# Patient Record
Sex: Female | Born: 1990 | Race: Black or African American | Hispanic: No | Marital: Single | Smoking: Never smoker
Health system: Southern US, Community
[De-identification: ages and names within clinical notes are randomized; demographics above are authoritative.]

---

## 2012-05-18 ENCOUNTER — Encounter (HOSPITAL_COMMUNITY): Payer: Self-pay | Admitting: Emergency Medicine

## 2012-05-18 ENCOUNTER — Emergency Department (HOSPITAL_COMMUNITY): Payer: BC Managed Care – PPO

## 2012-05-18 ENCOUNTER — Emergency Department (HOSPITAL_COMMUNITY)
Admission: EM | Admit: 2012-05-18 | Discharge: 2012-05-18 | Disposition: A | Discharge status: Home / Self Care | Payer: BC Managed Care – PPO | Attending: Emergency Medicine | Admitting: Emergency Medicine

## 2012-05-18 DIAGNOSIS — R1031 Right lower quadrant pain: Secondary | ICD-10-CM | POA: Insufficient documentation

## 2012-05-18 DIAGNOSIS — R109 Unspecified abdominal pain: Secondary | ICD-10-CM

## 2012-05-18 LAB — CBC WITH DIFFERENTIAL/PLATELET
Basophils Absolute: 0 10*3/uL (ref 0.0–0.1)
Basophils Relative: 0 % (ref 0–1)
Hemoglobin: 12.8 g/dL (ref 12.0–15.0)
Lymphocytes Relative: 19 % (ref 12–46)
MCHC: 33.5 g/dL (ref 30.0–36.0)
Neutro Abs: 6.4 10*3/uL (ref 1.7–7.7)
Neutrophils Relative %: 76 % (ref 43–77)
RDW: 12.7 % (ref 11.5–15.5)
WBC: 8.4 10*3/uL (ref 4.0–10.5)

## 2012-05-18 LAB — COMPREHENSIVE METABOLIC PANEL
ALT: 8 U/L (ref 0–35)
AST: 14 U/L (ref 0–37)
Albumin: 3.6 g/dL (ref 3.5–5.2)
Alkaline Phosphatase: 49 U/L (ref 39–117)
Chloride: 101 mEq/L (ref 96–112)
Potassium: 3.6 mEq/L (ref 3.5–5.1)
Total Bilirubin: 0.5 mg/dL (ref 0.3–1.2)

## 2012-05-18 LAB — URINALYSIS, ROUTINE W REFLEX MICROSCOPIC
Glucose, UA: NEGATIVE mg/dL
Protein, ur: 30 mg/dL — AB
Specific Gravity, Urine: 1.018 (ref 1.005–1.030)
Urobilinogen, UA: 1 mg/dL (ref 0.0–1.0)

## 2012-05-18 LAB — PREGNANCY, URINE: Preg Test, Ur: NEGATIVE

## 2012-05-18 MED ORDER — SODIUM CHLORIDE 0.9 % IV SOLN
INTRAVENOUS | Status: DC
  Administered 2012-05-18: 08:00:00 via INTRAVENOUS

## 2012-05-18 MED ORDER — PEG 3350-KCL-NABCB-NACL-NASULF 236 G PO SOLR: 4.0000 L | Freq: Once | ORAL | Status: DC

## 2012-05-18 MED ORDER — IOHEXOL 300 MG/ML  SOLN
100.0000 mL | Freq: Once | INTRAMUSCULAR | Status: AC | PRN
  Administered 2012-05-18: 100 mL via INTRAVENOUS

## 2012-05-18 MED ORDER — SODIUM CHLORIDE 0.9 % IV BOLUS (SEPSIS)
500.0000 mL | Freq: Once | INTRAVENOUS | Status: AC
  Administered 2012-05-18: 500 mL via INTRAVENOUS

## 2012-05-18 MED ORDER — SULFAMETHOXAZOLE-TRIMETHOPRIM 800-160 MG PO TABS: 1.0000 | ORAL_TABLET | Freq: Two times a day (BID) | ORAL | Status: DC

## 2012-05-18 NOTE — ED Notes (Signed)
Pt drinking contrast dye for CT scan

## 2012-05-18 NOTE — ED Provider Notes (Signed)
History     CSN: [id number]  Arrival date & time 05/18/12  [id number]   First MD Initiated Contact with Patient 05/18/12 [phone]      Chief Complaint  Patient presents with  . Abdominal Pain    (Consider location/radiation/quality/duration/timing/severity/associated sxs/prior treatment) HPI This 21 year old complains of right lower quadrant abdominal pain. She states 3 days ago she gradual onset of abdominal pain it is more in the left side than gradually transitioned to the mid lower abdomen area by the pelvic area and now has transitioned to the right lower quadrant tenderness only in the right lower quadrant. She's had right lower quadrant pain constantly since yesterday it waxes and wanes but is gradually worsening in intensity it is mild to moderately severe. It is worse position changes and bit her she stays still. It is associated nausea since yesterday with vomiting a few times over night which is nonbloody. She's not had a bowel movement in the last 3 days which is unusual for her. Her last bowel movement was 3 days ago with small little bit loose. It is nonbloody. She is no dysuria urinary frequency vaginal bleeding or vaginal discharge. Her last menstrual cycle was 2 weeks ago normal for her. Is no fever body aches rashes trauma cough chest pain or shortness of breath. No treatment prior to arrival. She does not want pain medicines at this time. History reviewed. No pertinent past medical history.  History reviewed. No pertinent past surgical history.  No family history on file.  History  Substance Use Topics  . Smoking status: Never Smoker   . Smokeless tobacco: Not on file  . Alcohol Use: No    OB History    Grav Para Term Preterm Abortions TAB SAB Ect Mult Living                  Review of Systems 10 Systems reviewed and are negative for acute change except as noted in the HPI. Allergies  Review of patient's allergies indicates no known allergies.  Home Medications    Current Outpatient Rx  Name Route Sig Dispense Refill  . IBUPROFEN 200 MG PO TABS Oral Take 400 mg by mouth every 6 (six) hours as needed.    Marland Kitchen PEG 3350-KCL-NABCB-NACL-NASULF 236 G PO SOLR Oral Take 4,000 mLs by mouth once. 4000 mL 0  . SULFAMETHOXAZOLE-TRIMETHOPRIM 800-160 MG PO TABS Oral Take 1 tablet by mouth 2 (two) times daily. One po bid x 3 days 6 tablet 0    BP 121/71  Pulse 78  Temp 98.2 F (36.8 C) (Oral)  Resp 14  Ht 5\' 2"  (1.575 m)  Wt 137 lb (62.143 kg)  BMI 25.06 kg/m2  SpO2 100%  LMP 05/04/2012  Physical Exam  Nursing note and vitals reviewed. Constitutional:       Awake, alert, nontoxic appearance.  HENT:  Head: Atraumatic.  Eyes: Right eye exhibits no discharge. Left eye exhibits no discharge.  Neck: Neck supple.  Cardiovascular: Normal rate and regular rhythm.   No murmur heard. Pulmonary/Chest: Effort normal and breath sounds normal. No respiratory distress. She has no wheezes. She has no rales. She exhibits no tenderness.  Abdominal: Soft. Bowel sounds are normal. She exhibits no distension and no mass. There is tenderness. There is no rebound and no guarding.       Mild right lower quadrant tenderness without rebound and the rest of the abdomen is nontender  Genitourinary:       Chaperone is present for bimanual  examination with no cervical motion tenderness no adnexal tenderness no pelvic tenderness no blood or discharge on the examination glove, check also present for rectal examination revealing rectal vault empty; back shows no CVA tenderness  Musculoskeletal: She exhibits no tenderness.       Baseline ROM, no obvious new focal weakness.  Neurological: She is alert.       Mental status and motor strength appears baseline for patient and situation.  Skin: No rash noted.  Psychiatric: She has a normal mood and affect.    ED Course  Procedures (including critical care time)  Labs Reviewed  URINALYSIS, ROUTINE W REFLEX MICROSCOPIC - Abnormal;  Notable for the following:    APPearance CLOUDY (*)     Hgb urine dipstick SMALL (*)     Protein, ur 30 (*)     Leukocytes, UA MODERATE (*)     All other components within normal limits  PREGNANCY, URINE  CBC WITH DIFFERENTIAL  COMPREHENSIVE METABOLIC PANEL  URINE MICROSCOPIC-ADD ON  LAB REPORT - SCANNED   No results found.   1. Abdominal pain       MDM  Pt stable in ED with no significant deterioration in condition.  Patient / Family / Caregiver informed of clinical course, understand medical decision-making process, and agree with plan.  I doubt any other EMC precluding discharge at this time including, but not necessarily limited to the following:SBI.        Hurman Horn, MD 05/23/12 [phone]

## 2012-05-18 NOTE — ED Notes (Signed)
Pt c/o abd pain onset yesterday. Pt states last normal BM last weekend. Pt states she had small amount diarrhea 3 days ago. Pt tearful.

## 2012-05-18 NOTE — ED Notes (Signed)
Pt alert, arrives from home, c/o abd pain, onset was last week, states started in left lower quad, now presents to right, describes pain as sharp, non radiating, states constipation, denies changes in bladder habits, resp even unlabored, skin pwd

## 2013-07-13 IMAGING — CT CT ABD-PELV W/ CM
1 of 2 series · 15 of 32 positions shown, 19 images · IV contrast (OMNIPAQUE 300)
Comparison: None.

CLINICAL DATA: Right lower quadrant pain, rule out appendicitis

CT ABDOMEN AND PELVIS WITH CONTRAST
TECHNIQUE: Multidetector CT imaging of the abdomen and pelvis was
performed following the standard protocol during bolus
administration of intravenous contrast.
Contrast: 100mL OMNIPAQUE IOHEXOL 300 MG/ML  SOLN

[Series 2: abd/pel with · axial · 0.58mm/px · z∈[+852,+1222]mm · 15 of 82 slices shown, 19 images]
[im 4/82  soft-tissue]
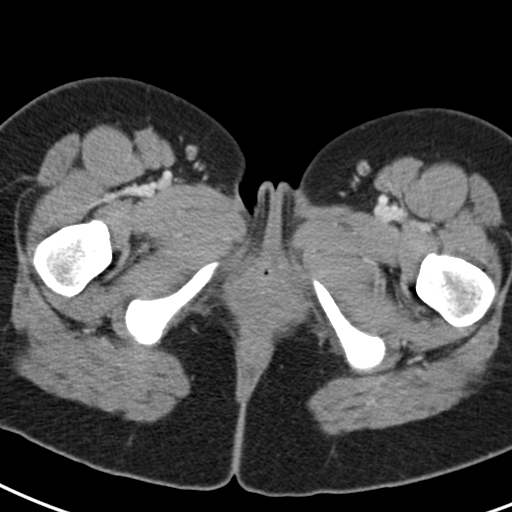
[im 4/82  bone]
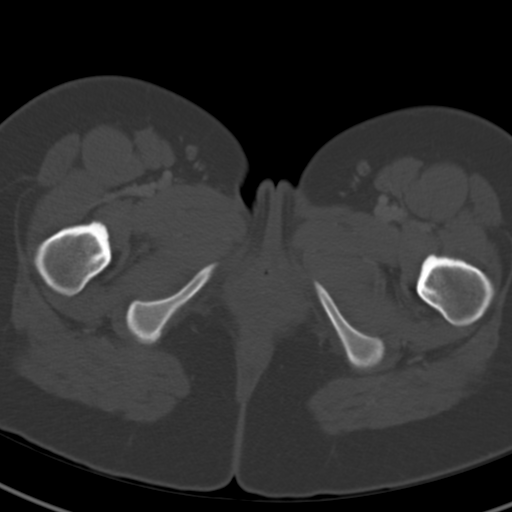
[im 10/82  soft-tissue]
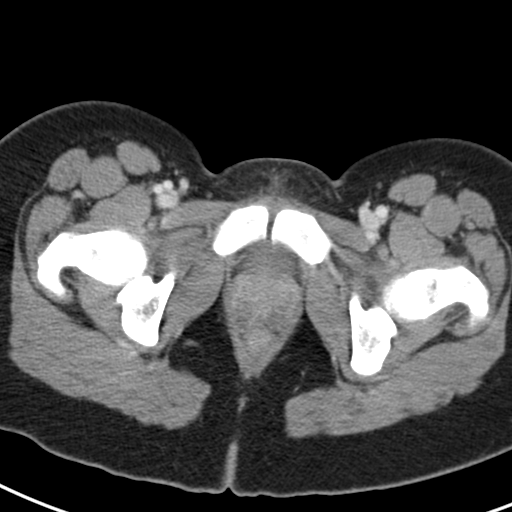
[im 17/82  soft-tissue]
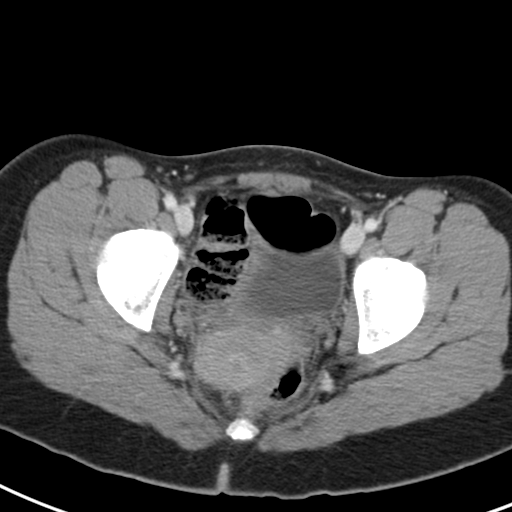
[im 23/82  soft-tissue]
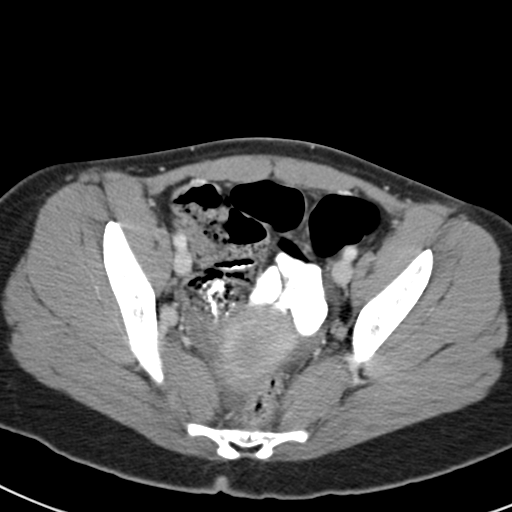
[im 30/82  soft-tissue]
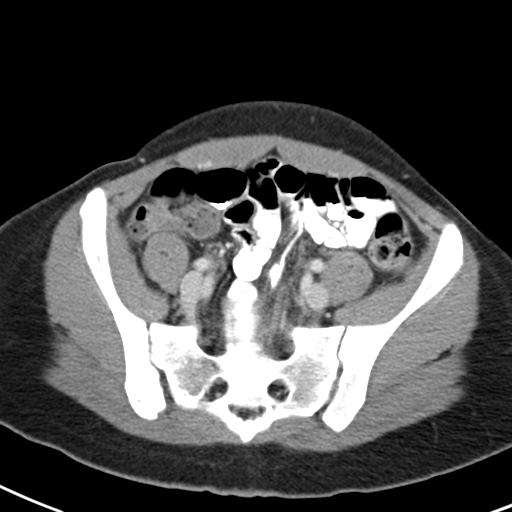
[im 36/82  soft-tissue]
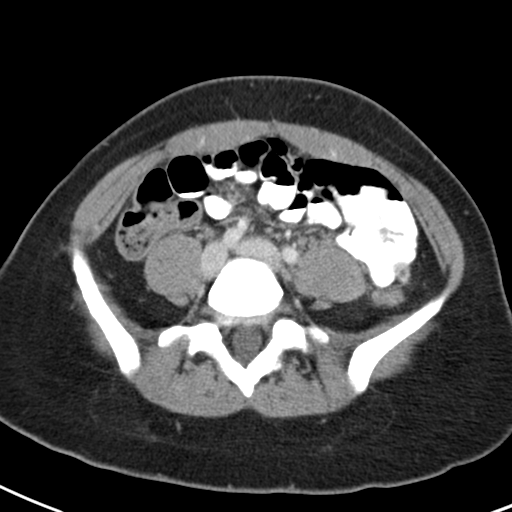
[im 43/82  soft-tissue]
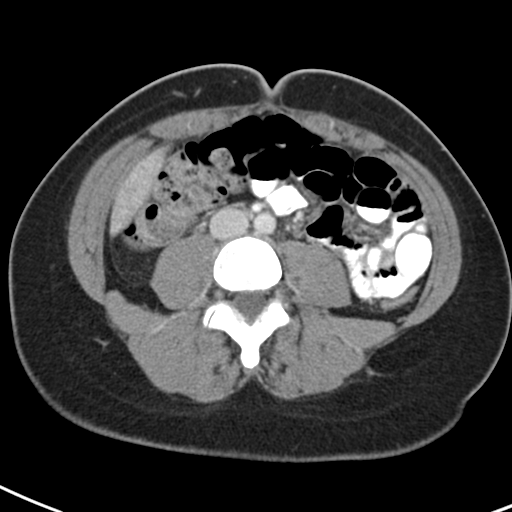
[im 46/82  soft-tissue]
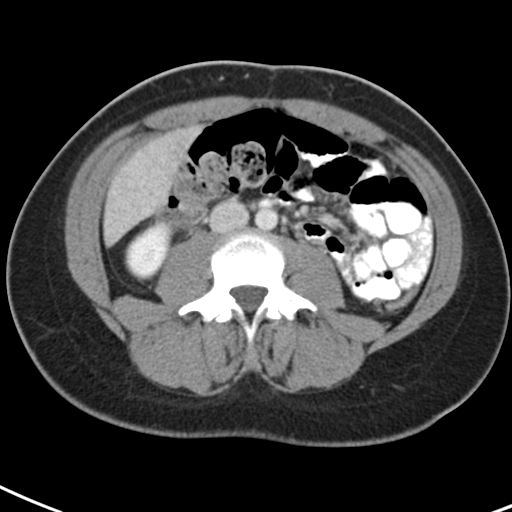
[im 52/82  soft-tissue]
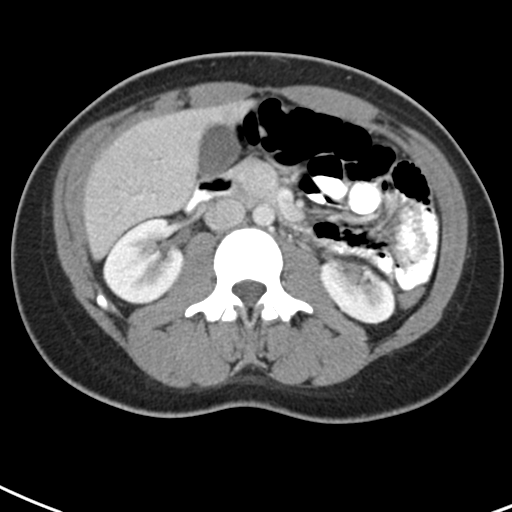
[im 52/82  bone]
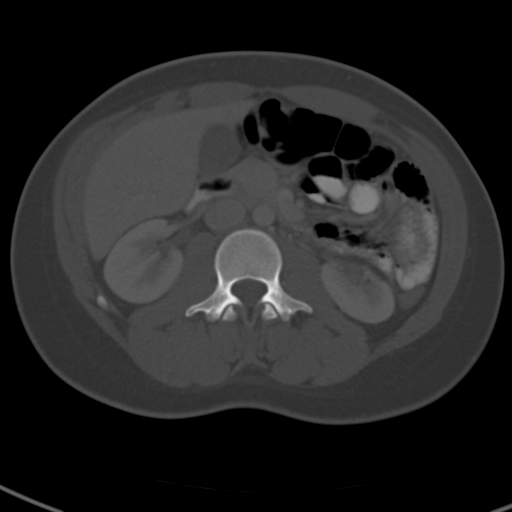
[im 59/82  soft-tissue]
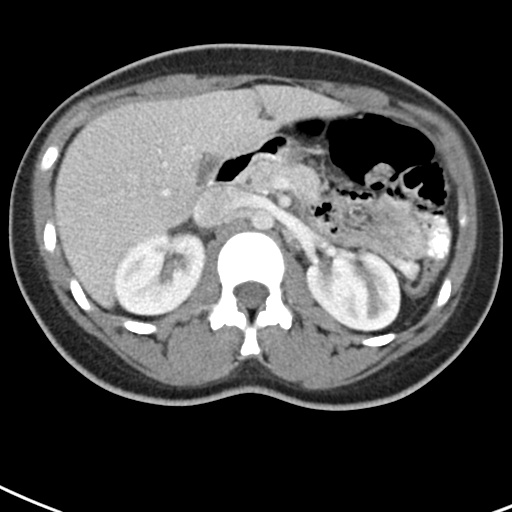
[im 65/82  soft-tissue]
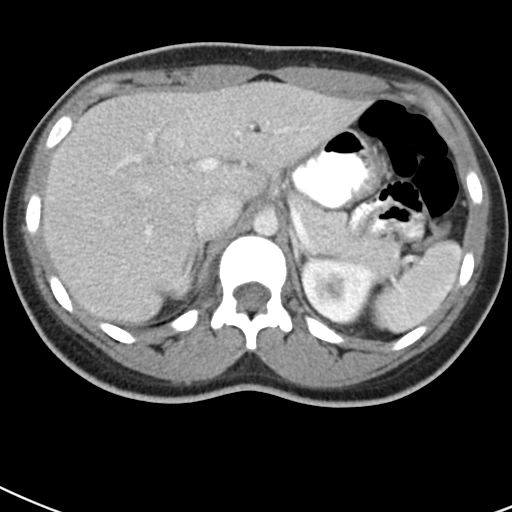
[im 69/82  lung]
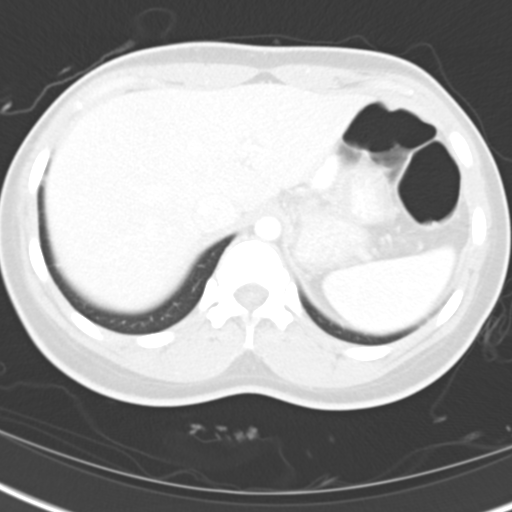
[im 72/82  soft-tissue]
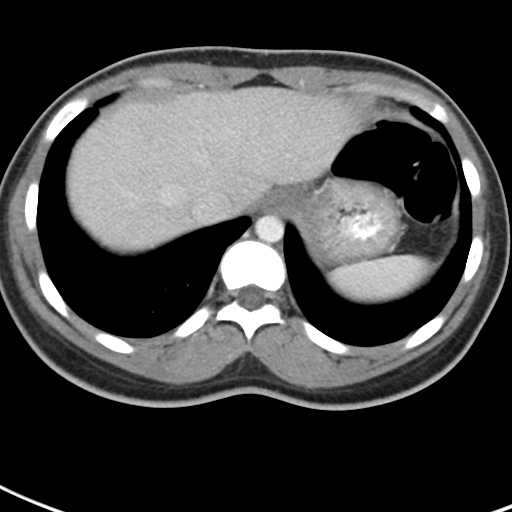
[im 72/82  lung]
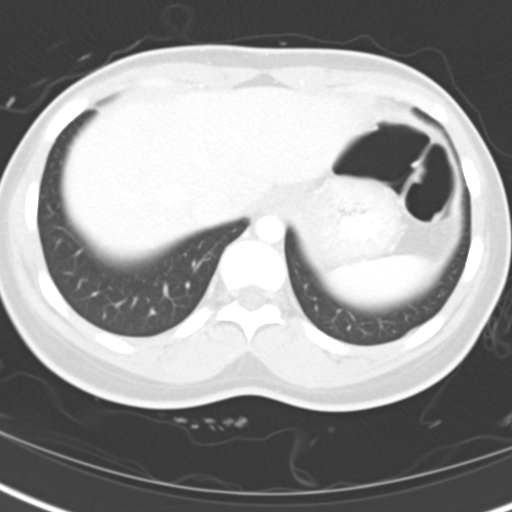
[im 75/82  lung]
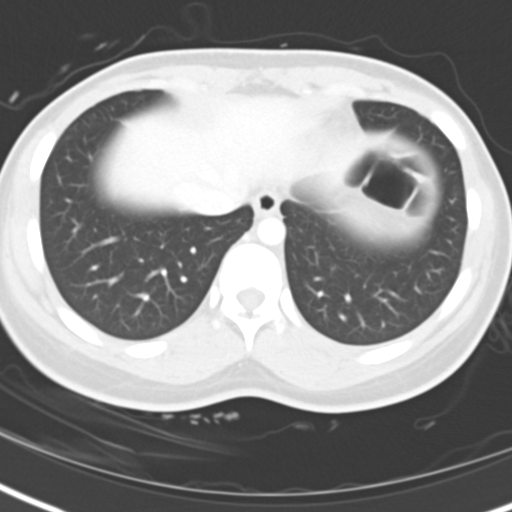
[im 78/82  soft-tissue]
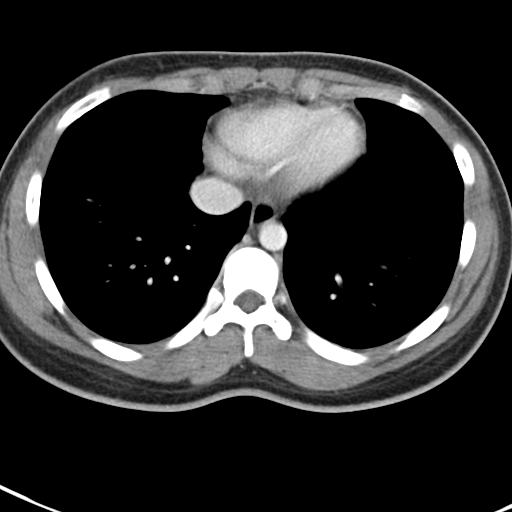
[im 78/82  lung]
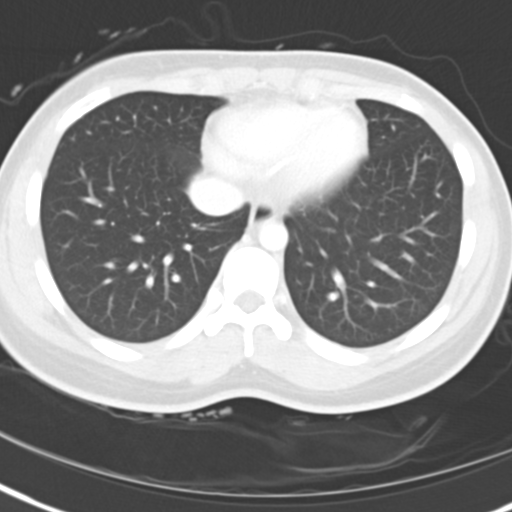

[15 of 32 positions shown; findings below may reference images not displayed]

FINDINGS: Sagittal images of the spine are unremarkable.  No
destructive bony lesions are noted.  Liver, spleen, pancreas and
adrenals are unremarkable.  No calcified gallstones are noted
within gallbladder.  Abdominal aorta is unremarkable.  Kidneys are
symmetrical in size and enhancement.  No hydronephrosis or
hydroureter.

There is no small bowel obstruction.  No ascites or free air.  No
adenopathy.

There is a low-lying cecum. Tip of the cecum is in the right
posterior pelvis just above the uterus and lateral to urinary
bladder.  Significant stool noted within cecum.  No definite
evidence of pericecal inflammation.  The appendix is not
identified.  The limited assessment of the colon non opacified with
contrast.  Only small amount of contrast is noted in the terminal
ileum and just beyond the ileocecal valve.

The uterus and adnexa are unremarkable.  Trace pelvic free fluid
within posterior cul-de-sac.
IMPRESSION: 1. Low-lying cecum. Significant stool noted within cecum.  No
evidence of pericecal inflammation.  The appendix is not
identified.  If there is high clinical suspicious for appendicitis
follow-up examination is recommended.
2.  No hydronephrosis or hydroureter.  No small bowel obstruction.
3.  Unremarkable uterus and adnexa.  Small amount of pelvic free
fluid noted posterior cul-de-sac.

## 2015-11-23 ENCOUNTER — Encounter: Payer: Self-pay | Admitting: Obstetrics and Gynecology

## 2015-11-23 ENCOUNTER — Ambulatory Visit (INDEPENDENT_AMBULATORY_CARE_PROVIDER_SITE_OTHER): Payer: Managed Care, Other (non HMO) | Admitting: Obstetrics and Gynecology

## 2015-11-23 VITALS — BP 130/82 | HR 80 | Resp 14 | Ht 62.5 in | Wt 177.0 lb

## 2015-11-23 DIAGNOSIS — Z01419 Encounter for gynecological examination (general) (routine) without abnormal findings: Secondary | ICD-10-CM

## 2015-11-23 DIAGNOSIS — Z0000 Encounter for general adult medical examination without abnormal findings: Secondary | ICD-10-CM | POA: Diagnosis not present

## 2015-11-23 DIAGNOSIS — Z124 Encounter for screening for malignant neoplasm of cervix: Secondary | ICD-10-CM | POA: Diagnosis not present

## 2015-11-23 DIAGNOSIS — Z113 Encounter for screening for infections with a predominantly sexual mode of transmission: Secondary | ICD-10-CM | POA: Diagnosis not present

## 2015-11-23 DIAGNOSIS — Z833 Family history of diabetes mellitus: Secondary | ICD-10-CM | POA: Insufficient documentation

## 2015-11-23 DIAGNOSIS — N946 Dysmenorrhea, unspecified: Secondary | ICD-10-CM | POA: Diagnosis not present

## 2015-11-23 LAB — HEMOGLOBIN A1C
Hgb A1c MFr Bld: 5.1 % (ref <5.7)
MEAN PLASMA GLUCOSE: 100 mg/dL

## 2015-11-23 LAB — CHOLESTEROL, TOTAL: CHOLESTEROL: 164 mg/dL (ref 125–200)

## 2015-11-23 LAB — HDL CHOLESTEROL: HDL: 66 mg/dL (ref >=46)

## 2015-11-23 MED ORDER — DROSPIRENONE-ETHINYL ESTRADIOL 3-0.02 MG PO TABS: 1.0000 | ORAL_TABLET | Freq: Every day | ORAL | Status: DC

## 2015-11-23 MED ORDER — NAPROXEN SODIUM 550 MG PO TABS: ORAL_TABLET | ORAL | Status: AC

## 2015-11-23 NOTE — Patient Instructions (Signed)
EXERCISE AND DIET:  We recommended that you start or continue a regular exercise program for good health. Regular exercise means any activity that makes your heart beat faster and makes you sweat.  We recommend exercising at least 30 minutes per day at least 3 days a week, preferably 4 or 5.  We also recommend a diet low in fat and sugar.  Inactivity, poor dietary choices and obesity can cause diabetes, heart attack, stroke, and kidney damage, among others.    ALCOHOL AND SMOKING:  Women should limit their alcohol intake to no more than 7 drinks/beers/glasses of wine (combined, not each!) per week. Moderation of alcohol intake to this level decreases your risk of breast cancer and liver damage. And of course, no recreational drugs are part of a healthy lifestyle.  And absolutely no smoking or even second hand smoke. Most people know smoking can cause heart and lung diseases, but did you know it also contributes to weakening of your bones? Aging of your skin?  Yellowing of your teeth and nails?  CALCIUM AND VITAMIN D:  Adequate intake of calcium and Vitamin D are recommended.  The recommendations for exact amounts of these supplements seem to change often, but generally speaking 600 mg of calcium (either carbonate or citrate) and 800 units of Vitamin D per day seems prudent. Certain women may benefit from higher intake of Vitamin D.  If you are among these women, your doctor will have told you during your visit.    PAP SMEARS:  Pap smears, to check for cervical cancer or precancers,  have traditionally been done yearly, although recent scientific advances have shown that most women can have pap smears less often.  However, every woman still should have a physical exam from her gynecologist every year. It will include a breast check, inspection of the vulva and vagina to check for abnormal growths or skin changes, a visual exam of the cervix, and then an exam to evaluate the size and shape of the uterus and  ovaries.  And after 23 years of age, a rectal exam is indicated to check for rectal cancers. We will also provide age appropriate advice regarding health maintenance, like when you should have certain vaccines, screening for sexually transmitted diseases, bone density testing, colonoscopy, mammograms, etc.   MAMMOGRAMS:  All women over 40 years old should have a yearly mammogram. Many facilities now offer a "3D" mammogram, which may cost around $50 extra out of pocket. If possible,  we recommend you accept the option to have the 3D mammogram performed.  It both reduces the number of women who will be called back for extra views which then turn out to be normal, and it is better than the routine mammogram at detecting truly abnormal areas.    COLONOSCOPY:  Colonoscopy to screen for colon cancer is recommended for all women at age 50.  We know, you hate the idea of the prep.  We agree, BUT, having colon cancer and not knowing it is worse!!  Colon cancer so often starts as a polyp that can be seen and removed at colonscopy, which can quite literally save your life!  And if your first colonoscopy is normal and you have no family history of colon cancer, most women don't have to have it again for 10 years.  Once every ten years, you can do something that may end up saving your life, right?  We will be happy to help you get it scheduled when you are ready.    Be sure to check your insurance coverage so you understand how much it will cost.  It may be covered as a preventative service at no cost, but you should check your particular policy.     Oral Contraception Information Oral contraceptive pills (OCPs) are medicines taken to prevent pregnancy. OCPs work by preventing the ovaries from releasing eggs. The hormones in OCPs also cause the cervical mucus to thicken, preventing the sperm from entering the uterus. The hormones also cause the uterine lining to become thin, not allowing a fertilized egg to attach to the  inside of the uterus. OCPs are highly effective when taken exactly as prescribed. However, OCPs do not prevent sexually transmitted diseases (STDs). Safe sex practices, such as using condoms along with the pill, can help prevent STDs.  Before taking the pill, you may have a physical exam and Pap test. Your health care provider may order blood tests. The health care provider will make sure you are a good candidate for oral contraception. Discuss with your health care provider the possible side effects of the OCP you may be prescribed. When starting an OCP, it can take 2 to 3 months for the body to adjust to the changes in hormone levels in your body.  TYPES OF ORAL CONTRACEPTION  The combination pill--This pill contains estrogen and progestin (synthetic progesterone) hormones. The combination pill comes in 21-day, 28-day, or 91-day packs. Some types of combination pills are meant to be taken continuously (365-day pills). With 21-day packs, you do not take pills for 7 days after the last pill. With 28-day packs, the pill is taken every day. The last 7 pills are without hormones. Certain types of pills have more than 21 hormone-containing pills. With 91-day packs, the first 84 pills contain both hormones, and the last 7 pills contain no hormones or contain estrogen only.  The minipill--This pill contains the progesterone hormone only. The pill is taken every day continuously. It is very important to take the pill at the same time each day. The minipill comes in packs of 28 pills. All 28 pills contain the hormone.  ADVANTAGES OF ORAL CONTRACEPTIVE PILLS  Decreases premenstrual symptoms.   Treats menstrual period cramps.   Regulates the menstrual cycle.   Decreases a heavy menstrual flow.   May treatacne, depending on the type of pill.   Treats abnormal uterine bleeding.   Treats polycystic ovarian syndrome.   Treats endometriosis.   Can be used as emergency contraception.  THINGS  THAT CAN MAKE ORAL CONTRACEPTIVE PILLS LESS EFFECTIVE OCPs can be less effective if:   You forget to take the pill at the same time every day.   You have a stomach or intestinal disease that lessens the absorption of the pill.   You take OCPs with other medicines that make OCPs less effective, such as antibiotics, certain HIV medicines, and some seizure medicines.   You take expired OCPs.   You forget to restart the pill on day 7, when using the packs of 21 pills.  RISKS ASSOCIATED WITH ORAL CONTRACEPTIVE PILLS  Oral contraceptive pills can sometimes cause side effects, such as:  Headache.  Nausea.  Breast tenderness.  Irregular bleeding or spotting. Combination pills are also associated with a small increased risk of:  Blood clots.  Heart attack.  Stroke.   This information is not intended to replace advice given to you by your health care provider. Make sure you discuss any questions you have with your health care provider.     Document Released: 10/06/2002 Document Revised: 05/06/2013 Document Reviewed: 01/04/2013 Elsevier Interactive Patient Education 2016 [employer] Inc.  

## 2015-11-23 NOTE — Progress Notes (Addendum)
Patient ID: Debbie Stokes, female   DOB: 11-20-1990, 25 y.o.   MRN: [id number] 25 y.o. [id number] SingleAfrican AmericanF here for annual exam.  Sexually active, same partner x 2 years. Using condoms. No dyspareunia.  Period Cycle (Days): 28 Period Duration (Days): 3-4 days  Period Pattern: Regular Menstrual Flow: Moderate, Light Menstrual Control: Maxi pad, Thin pad Dysmenorrhea: (!) Moderate Dysmenorrhea Symptoms: Cramping  Cramps have always been bad, able to function. OTC medication, take the edge off. Some mood changes on Yaz  Patient's last menstrual period was 11/04/2015.          Sexually active: Yes.    The current method of family planning is OCP (estrogen/progesterone).    Exercising: No.  The patient does not participate in regular exercise at present. Smoker:  no  Health Maintenance: Pap:  10/2013 WNL per patient History of abnormal Pap:  no TDaP:  2012 Gardasil: completed all 3    reports that she has never smoked. She has never used smokeless tobacco. She reports that she drinks about 0.6 oz of alcohol per week. She reports that she does not use illicit drugs.She recently graduated from [employer], degree in speech pathology. She wants to go back to get her masters. Working in administration   History reviewed. No pertinent past medical history.  History reviewed. No pertinent past surgical history.  Current Outpatient Prescriptions  Medication Sig Dispense Refill  . ibuprofen (ADVIL,MOTRIN) 200 MG tablet Take 400 mg by mouth every 6 (six) hours as needed.    . norethindrone-ethinyl estradiol (JUNEL FE,GILDESS FE,LOESTRIN FE) 1-20 MG-MCG tablet Take 1 tablet by mouth daily.     No current facility-administered medications for this visit.    Family History  Problem Relation Age of Onset  . Hypertension Mother   . Diabetes Maternal Grandfather     Review of Systems  Constitutional: Negative.   HENT: Negative.   Eyes: Negative.   Respiratory: Negative.    Cardiovascular: Negative.   Gastrointestinal: Negative.   Endocrine: Negative.   Genitourinary: Negative.   Musculoskeletal: Negative.   Skin: Negative.   Allergic/Immunologic: Negative.   Neurological: Negative.   Psychiatric/Behavioral: Negative.     Exam:   BP 130/82 mmHg  Pulse 80  Resp 14  Ht 5' 2.5" (1.588 m)  Wt 177 lb (80.287 kg)  BMI 31.84 kg/m2  LMP 11/04/2015  Weight change: @ Height:   Height: 5' 2.5" (158.8 cm)  Ht Readings from Last 3 Encounters:  11/23/15 5' 2.5" (1.588 m)  05/18/12  (1.575 m) (18 %*, Z = -0.90)   * Growth percentiles are based on CDC 2-20 Years data.    General appearance: alert, cooperative and appears stated age Head: Normocephalic, without obvious abnormality, atraumatic Neck: no adenopathy, supple, symmetrical, trachea midline and thyroid normal to inspection and palpation Lungs: clear to auscultation bilaterally Breasts: normal appearance, no masses or tenderness Heart: regular rate and rhythm Abdomen: soft, non-tender; bowel sounds normal; no masses,  no organomegaly Extremities: extremities normal, atraumatic, no cyanosis or edema Skin: Skin color, texture, turgor normal. No rashes or lesions Lymph nodes: Cervical, supraclavicular, and axillary nodes normal. No abnormal inguinal nodes palpated Neurologic: Grossly normal   Pelvic: External genitalia:  no lesions              Urethra:  normal appearing urethra with no masses, tenderness or lesions              Bartholins and Skenes: normal  Vagina: normal appearing vagina with normal color and discharge, no lesions              Cervix: no lesions               Bimanual Exam:  Uterus:  normal size, contour, position, consistency, mobility, non-tender and anteverted              Adnexa: no mass, fullness, tenderness               Rectovaginal: Confirms               Anus:  normal sphincter tone, no lesions  Chaperone was present for exam.  A:   Well Woman with normal exam  Contraception, moody on her current pill  Dysmenorrhea    P:   STD testing  Cholesterol, HDL, HgbA1C  Pap  Discussed breast self exam  Discussed calcium and vit D intake  Change to Yaz, discussed the increased risk of clots with Yaz (no contraindications to OCP's)  Continue condoms  Anaprox for dysmenorrhea    Addendum: the patient c/o a low libido, discussed reasons for low libido. Reading suggestions given

## 2015-11-24 ENCOUNTER — Telehealth: Payer: Self-pay | Admitting: *Deleted

## 2015-11-24 LAB — STD PANEL
HIV: NONREACTIVE
Hepatitis B Surface Ag: NEGATIVE

## 2015-11-24 LAB — HEPATITIS C ANTIBODY: HCV Ab: NEGATIVE

## 2015-11-24 NOTE — Telephone Encounter (Signed)
Patient notified (see results) 

## 2015-11-24 NOTE — Telephone Encounter (Signed)
11-04-15 LMTC in regards to lab results -eh

## 2015-11-24 NOTE — Telephone Encounter (Signed)
-----   Message from [name], MD sent at 11/24/2015  1:17 PM EDT ----- Please advise the patient of normal results. genprobe and pap pending

## 2015-11-25 LAB — IPS PAP SMEAR ONLY

## 2015-11-25 LAB — IPS N GONORRHOEA AND CHLAMYDIA BY PCR

## 2016-03-06 ENCOUNTER — Encounter: Payer: Self-pay | Admitting: Obstetrics and Gynecology

## 2016-03-07 ENCOUNTER — Telehealth: Payer: Self-pay

## 2016-03-07 MED ORDER — DROSPIRENONE-ETHINYL ESTRADIOL 3-0.02 MG PO TABS: 1.0000 | ORAL_TABLET | Freq: Every day | ORAL | 2 refills | Status: DC

## 2016-03-07 NOTE — Telephone Encounter (Signed)
Please let the patient know that I sent in her script for Yaz. F/U at her annual next year.

## 2016-03-07 NOTE — Telephone Encounter (Signed)
Telephone encounter created to discuss mychart message with Dr.Jertson. 

## 2016-03-07 NOTE — Telephone Encounter (Signed)
Dr.Jertson, okay to refill patient's Yaz until her next aex?  Non-Urgent Medical Question  Message [phone]  From Debbie Stokes To [name], MD Sent 03/06/2016 12:33 PM  Hello Dr. Oscar LaJertson,  I visited in April and we decided on a 3- month prescription for Yaz to see how I liked it. I really like this change in birth control and would like to have this prescription extended if possible.  Thank you,  Debbie Stokes   Responsible Party   Pool - [facility] Pool No one has taken responsibility for this message.  No actions have been taken on this message.

## 2016-03-08 NOTE — Telephone Encounter (Signed)
Spoke with patient. Advised patient that refills for her [name] have been sent to her pharmacy on file until her next aex. She is agreeable and verbalizes understanding.  Routing to provider for final review. Patient agreeable to disposition. Will close encounter.

## 2016-10-31 ENCOUNTER — Other Ambulatory Visit: Payer: Self-pay | Admitting: Obstetrics and Gynecology

## 2016-10-31 NOTE — Telephone Encounter (Signed)
Medication refill request: OCP Last AEX:  11-23-15 Next AEX: 11-28-16 Last MMG (if hormonal medication request): N/A Refill authorized: please advise

## 2016-11-28 ENCOUNTER — Ambulatory Visit (INDEPENDENT_AMBULATORY_CARE_PROVIDER_SITE_OTHER): Payer: Self-pay | Admitting: Obstetrics and Gynecology

## 2016-11-28 ENCOUNTER — Encounter: Payer: Self-pay | Admitting: Obstetrics and Gynecology

## 2016-11-28 VITALS — BP 120/80 | HR 80 | Resp 14 | Ht 62.5 in | Wt 172.0 lb

## 2016-11-28 DIAGNOSIS — Z113 Encounter for screening for infections with a predominantly sexual mode of transmission: Secondary | ICD-10-CM

## 2016-11-28 DIAGNOSIS — Z01419 Encounter for gynecological examination (general) (routine) without abnormal findings: Secondary | ICD-10-CM

## 2016-11-28 DIAGNOSIS — Z3041 Encounter for surveillance of contraceptive pills: Secondary | ICD-10-CM

## 2016-11-28 MED ORDER — DROSPIRENONE-ETHINYL ESTRADIOL 3-0.02 MG PO TABS: 1.0000 | ORAL_TABLET | Freq: Every day | ORAL | 3 refills | Status: AC

## 2016-11-28 NOTE — Addendum Note (Signed)
Addended by: Shelda Jakes E on: 11/28/2016 04:39 PM   Modules accepted: Orders

## 2016-11-28 NOTE — Progress Notes (Signed)
26 y.o. G0P0000 SingleAfrican AmericanF here for annual exam.  Sexually active, same partner x 3 years. Very serious. Uses condoms.  Period Cycle (Days): 28 Period Duration (Days): 4 days  Period Pattern: Regular Menstrual Flow: Light, Moderate Menstrual Control: Maxi pad Menstrual Control Change Freq (Hours): changes pad every 3-4 hours  Dysmenorrhea: (!) Mild Dysmenorrhea Symptoms: Cramping  Patient's last menstrual period was 11/03/2016.          Sexually active: Yes.    The current method of family planning is OCP (estrogen/progesterone).    Exercising: No.  The patient does not participate in regular exercise at present. Smoker:  no  Health Maintenance: Pap:  11-23-15 WNL  History of abnormal Pap:  no TDaP:  2012 Gardasil: completed all 3    reports that she has never smoked. She has never used smokeless tobacco. She reports that she drinks about 0.6 oz of alcohol per week . She reports that she does not use drugs. Has her degree in speech pathology. Currently getting her masters, 1/2 way through.    No past medical history on file.  No past surgical history on file.  Current Outpatient Prescriptions  Medication Sig Dispense Refill  . ibuprofen (ADVIL,MOTRIN) 200 MG tablet Take 400 mg by mouth every 6 (six) hours as needed.    Marland Kitchen LORYNA 3-0.02 MG tablet TAKE 1 TABLET BY MOUTH DAILY. 84 tablet 0  . naproxen sodium (ANAPROX DS) 550 MG tablet 1 tab po q 12 hours prn 30 tablet 2   No current facility-administered medications for this visit.     Family History  Problem Relation Age of Onset  . Hypertension Mother   . Sleep apnea Mother   . Diabetes Maternal Grandfather     Review of Systems  Constitutional: Negative.   HENT: Negative.   Eyes: Negative.   Respiratory: Negative.   Cardiovascular: Negative.   Gastrointestinal: Negative.   Endocrine: Negative.   Genitourinary: Negative.   Musculoskeletal: Negative.   Skin: Negative.   Allergic/Immunologic: Negative.    Neurological: Negative.   Psychiatric/Behavioral: Negative.     Exam:   BP 120/80 (BP Location: Right Arm, Patient Position: Sitting, Cuff Size: Normal)   Pulse 80   Resp 14   Ht 5' 2.5" (1.588 m)   Wt 172 lb (78 kg)   LMP 11/03/2016   BMI 30.96 kg/m   Weight change: @ Height:   Height: 5' 2.5" (158.8 cm)  Ht Readings from Last 3 Encounters:  11/28/16 5' 2.5" (1.588 m)  11/23/15 5' 2.5" (1.588 m)  05/18/12  (1.575 m) (18 %, Z= -0.90)*   * Growth percentiles are based on CDC 2-20 Years data.    General appearance: alert, cooperative and appears stated age Head: Normocephalic, without obvious abnormality, atraumatic Neck: no adenopathy, supple, symmetrical, trachea midline and thyroid normal to inspection and palpation Lungs: clear to auscultation bilaterally Cardiovascular: regular rate and rhythm Breasts: normal appearance, no masses or tenderness Abdomen: soft, non-tender; bowel sounds normal; no masses,  no organomegaly Extremities: extremities normal, atraumatic, no cyanosis or edema Skin: Skin color, texture, turgor normal. No rashes or lesions Lymph nodes: Cervical, supraclavicular, and axillary nodes normal. No abnormal inguinal nodes palpated Neurologic: Grossly normal   Pelvic: External genitalia:  no lesions              Urethra:  normal appearing urethra with no masses, tenderness or lesions              Bartholins and Skenes:  normal                 Vagina: normal appearing vagina with normal color and discharge, no lesions              Cervix: no lesions               Bimanual Exam:  Uterus:  normal size, contour, position, consistency, mobility, non-tender              Adnexa: no mass, fullness, tenderness               Rectovaginal: Confirms               Anus:  normal sphincter tone, no lesions  Chaperone was present for exam.  A:  Well Woman with normal exam  P:   Continue OCP's  No pap today  Normal screening labs  Negative STD  testing last year  Will just test for GC/CT  Discussed breast self exam  Discussed calcium and vit D intake

## 2016-11-28 NOTE — Patient Instructions (Signed)

## 2016-11-29 LAB — GC/CHLAMYDIA PROBE AMP
CT PROBE, AMP APTIMA: NOT DETECTED
GC PROBE AMP APTIMA: NOT DETECTED

## 2017-12-04 ENCOUNTER — Ambulatory Visit: Payer: Managed Care, Other (non HMO) | Admitting: Obstetrics and Gynecology
